# Patient Record
Sex: Female | Born: 1965 | Race: Black or African American | Hispanic: No | Marital: Single | Smoking: Never smoker
Health system: Southern US, Community
[De-identification: ages and names within clinical notes are randomized; demographics above are authoritative.]

## PROBLEM LIST (undated history)

## (undated) HISTORY — PX: ABDOMINAL HYSTERECTOMY: SHX81

---

## 2015-04-16 ENCOUNTER — Emergency Department (HOSPITAL_COMMUNITY)
Admission: EM | Admit: 2015-04-16 | Discharge: 2015-04-17 | Disposition: A | Discharge status: Home / Self Care | Payer: Self-pay | Attending: Emergency Medicine | Admitting: Emergency Medicine

## 2015-04-16 ENCOUNTER — Encounter (HOSPITAL_COMMUNITY): Payer: Self-pay | Admitting: Emergency Medicine

## 2015-04-16 DIAGNOSIS — Z79899 Other long term (current) drug therapy: Secondary | ICD-10-CM | POA: Insufficient documentation

## 2015-04-16 DIAGNOSIS — R110 Nausea: Secondary | ICD-10-CM | POA: Insufficient documentation

## 2015-04-16 DIAGNOSIS — R42 Dizziness and giddiness: Secondary | ICD-10-CM | POA: Insufficient documentation

## 2015-04-16 DIAGNOSIS — R61 Generalized hyperhidrosis: Secondary | ICD-10-CM | POA: Insufficient documentation

## 2015-04-16 DIAGNOSIS — R55 Syncope and collapse: Secondary | ICD-10-CM | POA: Insufficient documentation

## 2015-04-16 LAB — BASIC METABOLIC PANEL
Anion gap: 9 (ref 5–15)
BUN: 13 mg/dL (ref 6–23)
CHLORIDE: 104 mmol/L (ref 96–112)
CO2: 23 mmol/L (ref 19–32)
Calcium: 8.2 mg/dL — ABNORMAL LOW (ref 8.4–10.5)
Creatinine, Ser: 0.96 mg/dL (ref 0.50–1.10)
GFR calc Af Amer: 78 mL/min — ABNORMAL LOW (ref >90)
GFR calc non Af Amer: 67 mL/min — ABNORMAL LOW (ref >90)
Glucose, Bld: 103 mg/dL — ABNORMAL HIGH (ref 70–99)
POTASSIUM: 4.4 mmol/L (ref 3.5–5.1)
SODIUM: 136 mmol/L (ref 135–145)

## 2015-04-16 MED ORDER — SODIUM CHLORIDE 0.9 % IV BOLUS (SEPSIS)
1000.0000 mL | Freq: Once | INTRAVENOUS | Status: AC
  Administered 2015-04-16: 1000 mL via INTRAVENOUS

## 2015-04-16 NOTE — ED Notes (Signed)
Per EMS- pt was giving plasma, near end of giving she became nauseous and weak. Pt reports vomiting and diarrhea. Pt did not lose consciousness. Pt denies CP, SOB. NAD at ths time.

## 2015-04-16 NOTE — ED Provider Notes (Addendum)
CSN: [id number]     Arrival date & time 04/16/15  2156 History   First MD Initiated Contact with Patient 04/16/15 2214     Chief Complaint  Patient presents with  . Near Syncope     (Consider location/radiation/quality/duration/timing/severity/associated sxs/prior Treatment) HPI Comments: Pt with no medical hx, on oral estrogen comes in with cc of near syncope. Reports that she was donating plasma, and after the donation, whilst she was getting saline, she started getting dizzy, nauseated, sick to her stomach and might have even blacked out. She reports some sweating as well. There was no chest pain, dib. Pt has no hx of PE, DVT and denies any long distance travels or surgery in the past 6 weeks, active cancer, recent immobilization.   Patient is a 49 y.o. female presenting with near-syncope. The history is provided by the patient.  Near Syncope Pertinent negatives include no chest pain, no abdominal pain, no headaches and no shortness of breath.    History reviewed. No pertinent past medical history. Past Surgical History  Procedure Laterality Date  . Abdominal hysterectomy     History reviewed. No pertinent family history. History  Substance Use Topics  . Smoking status: Never Smoker   . Smokeless tobacco: Never Used  . Alcohol Use: No   OB History    No data available     Review of Systems  Constitutional: Positive for diaphoresis. Negative for activity change.  Respiratory: Negative for shortness of breath.   Cardiovascular: Positive for near-syncope. Negative for chest pain.  Gastrointestinal: Positive for nausea. Negative for vomiting and abdominal pain.  Genitourinary: Negative for dysuria.  Musculoskeletal: Negative for neck pain.  Neurological: Positive for syncope and light-headedness. Negative for headaches.      Allergies  Review of patient's allergies indicates no known allergies.  Home Medications   Prior to Admission medications   Medication Sig  Start Date End Date Taking? Authorizing Provider  estradiol (ESTRACE) 1 MG tablet Take 1 mg by mouth daily.   Yes Historical Provider, MD  Naproxen Sodium 220 MG CAPS Take 220 mg by mouth daily as needed (pain).   Yes Historical Provider, MD   BP 103/44 mmHg  Pulse 71  Resp 16  Ht  (1.626 m)  Wt 189 lb (85.73 kg)  BMI 32.43 kg/m2  SpO2 99% Physical Exam  Constitutional: She is oriented to person, place, and time. She appears well-developed and well-nourished.  HENT:  Head: Normocephalic and atraumatic.  Eyes: EOM are normal. Pupils are equal, round, and reactive to light.  Neck: Neck supple.  Cardiovascular: Normal rate, regular rhythm and normal heart sounds.   No murmur heard. Pulmonary/Chest: Effort normal. No respiratory distress.  Abdominal: Soft. She exhibits no distension. There is no tenderness. There is no rebound and no guarding.  Musculoskeletal: She exhibits no edema or tenderness.  Neurological: She is alert and oriented to person, place, and time.  Skin: Skin is warm and dry.  Nursing note and vitals reviewed.   ED Course  Procedures (including critical care time) Labs Review Labs Reviewed  CBC WITH DIFFERENTIAL/PLATELET  BASIC METABOLIC PANEL    Imaging Review No results found.   EKG Interpretation   Date/Time:  Monday April 16 2015 22:07:28 EDT Ventricular Rate:  80 PR Interval:  184 QRS Duration: 75 QT Interval:  410 QTC Calculation: 473 R Axis:   79 Text Interpretation:  Sinus rhythm Low voltage, precordial leads Normal  intervals No old tracing to compare Confirmed by Vyolet Sakuma,  MD, Janey GentaANKIT  [phone] on 04/16/2015 10:38:50 PM      MDM   Final diagnoses:  Near syncope    Pt comes in with near syncope / syncope. She had no seizure like activity. Symptoms occurred right after she was done with donating plasma/platelets. No bleeding per history. No cardiac prodrome. On oral estrogen, but doubt PE. Screening ekg ordered. Will hydrate. SF  syncope score is 0.    Derwood KaplanAnkit Hannie Shoe, MD 04/16/15 [id number]  Derwood KaplanAnkit Pacey Altizer, MD 04/17/15 [id number]

## 2015-04-17 LAB — CBC WITH DIFFERENTIAL/PLATELET
BASOS ABS: 0 10*3/uL (ref 0.0–0.1)
Basophils Relative: 0 % (ref 0–1)
EOS PCT: 1 % (ref 0–5)
Eosinophils Absolute: 0.2 10*3/uL (ref 0.0–0.7)
HEMATOCRIT: 37.4 % (ref 36.0–46.0)
Hemoglobin: 12.8 g/dL (ref 12.0–15.0)
LYMPHS ABS: 2.3 10*3/uL (ref 0.7–4.0)
Lymphocytes Relative: 16 % (ref 12–46)
MCH: 26.9 pg (ref 26.0–34.0)
MCHC: 34.2 g/dL (ref 30.0–36.0)
MCV: 78.7 fL (ref 78.0–100.0)
MONO ABS: 0.8 10*3/uL (ref 0.1–1.0)
Monocytes Relative: 5 % (ref 3–12)
Neutro Abs: 11.3 10*3/uL — ABNORMAL HIGH (ref 1.7–7.7)
Neutrophils Relative %: 77 % (ref 43–77)
PLATELETS: 165 10*3/uL (ref 150–400)
RBC: 4.75 MIL/uL (ref 3.87–5.11)
RDW: 14 % (ref 11.5–15.5)
WBC: 14.6 10*3/uL — AB (ref 4.0–10.5)

## 2015-04-17 NOTE — Discharge Instructions (Signed)
We saw you in the ER for the near fainting. All the results in the ER are normal.  The workup in the ER is not complete, and is limited to screening for life threatening and emergent conditions only, so please see a primary care doctor for further evaluation. Hydrate well.   Vasovagal Syncope, Adult Syncope, commonly known as fainting, is a temporary loss of consciousness. It occurs when the blood flow to the brain is reduced. Vasovagal syncope (also called neurocardiogenic syncope) is a fainting spell in which the blood flow to the brain is reduced because of a sudden drop in heart rate and blood pressure. Vasovagal syncope occurs when the brain and the cardiovascular system (blood vessels) do not adequately communicate and respond to each other. This is the most common cause of fainting. It often occurs in response to fear or some other type of emotional or physical stress. The body has a reaction in which the heart starts beating too slowly or the blood vessels expand, reducing blood pressure. This type of fainting spell is generally considered harmless. However, injuries can occur if a person takes a sudden fall during a fainting spell.  CAUSES  Vasovagal syncope occurs when a person's blood pressure and heart rate decrease suddenly, usually in response to a trigger. Many things and situations can trigger an episode. Some of these include:   Pain.   Fear.   The sight of blood or medical procedures, such as blood being drawn from a vein.   Common activities, such as coughing, swallowing, stretching, or going to the bathroom.   Emotional stress.   Prolonged standing, especially in a warm environment.   Lack of sleep or rest.   Prolonged lack of food.   Prolonged lack of fluids.   Recent illness.  The use of certain drugs that affect blood pressure, such as cocaine, alcohol, marijuana, inhalants, and opiates.  SYMPTOMS  Before the fainting episode, you may:   Feel dizzy  or light headed.   Become pale.  Sense that you are going to faint.   Feel like the room is spinning.   Have tunnel vision, only seeing directly in front of you.   Feel sick to your stomach (nauseous).   See spots or slowly lose vision.   Hear ringing in your ears.   Have a headache.   Feel warm and sweaty.   Feel a sensation of pins and needles. During the fainting spell, you will generally be unconscious for no longer than a couple minutes before waking up and returning to normal. If you get up too quickly before your body can recover, you may faint again. Some twitching or jerky movements may occur during the fainting spell.  DIAGNOSIS  Your caregiver will ask about your symptoms, take a medical history, and perform a physical exam. Various tests may be done to rule out other causes of fainting. These may include blood tests and tests to check the heart, such as electrocardiography, echocardiography, and possibly an electrophysiology study. When other causes have been ruled out, a test may be done to check the body's response to changes in position (tilt table test). TREATMENT  Most cases of vasovagal syncope do not require treatment. Your caregiver may recommend ways to avoid fainting triggers and may provide home strategies for preventing fainting. If you must be exposed to a possible trigger, you can drink additional fluids to help reduce your chances of having an episode of vasovagal syncope. If you have warning signs of  an oncoming episode, you can respond by positioning yourself favorably (lying down). If your fainting spells continue, you may be given medicines to prevent fainting. Some medicines may help make you more resistant to repeated episodes of vasovagal syncope. Special exercises or compression stockings may be recommended. In rare cases, the surgical placement of a pacemaker is considered. HOME CARE INSTRUCTIONS   Learn to identify the warning signs of  vasovagal syncope.   Sit or lie down at the first warning sign of a fainting spell. If sitting, put your head down between your legs. If you lie down, swing your legs up in the air to increase blood flow to the brain.   Avoid hot tubs and saunas.  Avoid prolonged standing.  Drink enough fluids to keep your urine clear or pale yellow. Avoid caffeine.  Increase salt in your diet as directed by your caregiver.   If you have to stand for a long time, perform movements such as:   Crossing your legs.   Flexing and stretching your leg muscles.   Squatting.   Moving your legs.   Bending over.   Only take over-the-counter or prescription medicines as directed by your caregiver. Do not suddenly stop any medicines without asking your caregiver first. SEEK MEDICAL CARE IF:   Your fainting spells continue or happen more frequently in spite of treatment.   You lose consciousness for more than a couple minutes.  You have fainting spells during or after exercising or after being startled.   You have new symptoms that occur with the fainting spells, such as:   Shortness of breath.  Chest pain.   Irregular heartbeat.   You have episodes of twitching or jerky movements that last longer than a few seconds.  You have episodes of twitching or jerky movements without obvious fainting. SEEK IMMEDIATE MEDICAL CARE IF:   You have injuries or bleeding after a fainting spell.   You have episodes of twitching or jerky movements that last longer than 5 minutes.   You have more than one spell of twitching or jerky movements before returning to consciousness after fainting. MAKE SURE YOU:   Understand these instructions.  Will watch your condition.  Will get help right away if you are not doing well or get worse. Document Released: 11/24/2012 Document Reviewed: 11/24/2012 [facility] Patient Information 2015 [place], [place]. This information is not intended to replace advice  given to you by your health care provider. Make sure you discuss any questions you have with your health care provider.

## 2019-07-29 ENCOUNTER — Ambulatory Visit
Admission: RE | Admit: 2019-07-29 | Discharge: 2019-07-29 | Disposition: A | Discharge status: Home / Self Care | Payer: Disability Insurance | Source: Ambulatory Visit | Attending: General Practice | Admitting: General Practice

## 2019-07-29 ENCOUNTER — Other Ambulatory Visit: Payer: Self-pay | Admitting: General Practice

## 2019-07-29 DIAGNOSIS — M5126 Other intervertebral disc displacement, lumbar region: Secondary | ICD-10-CM

## 2019-07-29 DIAGNOSIS — G8929 Other chronic pain: Secondary | ICD-10-CM

## 2021-06-17 ENCOUNTER — Other Ambulatory Visit: Payer: Self-pay

## 2021-06-17 ENCOUNTER — Emergency Department: Payer: Medicare Other

## 2021-06-17 ENCOUNTER — Emergency Department
Admission: EM | Admit: 2021-06-17 | Discharge: 2021-06-18 | Disposition: A | Discharge status: Home / Self Care | Payer: Medicare Other | Attending: Emergency Medicine | Admitting: Emergency Medicine

## 2021-06-17 DIAGNOSIS — R197 Diarrhea, unspecified: Secondary | ICD-10-CM | POA: Diagnosis not present

## 2021-06-17 DIAGNOSIS — R1084 Generalized abdominal pain: Secondary | ICD-10-CM | POA: Insufficient documentation

## 2021-06-17 DIAGNOSIS — R1110 Vomiting, unspecified: Secondary | ICD-10-CM | POA: Insufficient documentation

## 2021-06-17 LAB — COMPREHENSIVE METABOLIC PANEL
ALT: 20 U/L (ref 0–44)
AST: 35 U/L (ref 15–41)
Albumin: 4.1 g/dL (ref 3.5–5.0)
Alkaline Phosphatase: 58 U/L (ref 38–126)
Anion gap: 9 (ref 5–15)
BUN: 16 mg/dL (ref 6–20)
CO2: 24 mmol/L (ref 22–32)
Calcium: 9.1 mg/dL (ref 8.9–10.3)
Chloride: 105 mmol/L (ref 98–111)
Creatinine, Ser: 0.8 mg/dL (ref 0.44–1.00)
GFR, Estimated: >60 mL/min (ref >60)
Glucose, Bld: 122 mg/dL — ABNORMAL HIGH (ref 70–99)
Potassium: 3.3 mmol/L — ABNORMAL LOW (ref 3.5–5.1)
Sodium: 138 mmol/L (ref 135–145)
Total Bilirubin: 0.4 mg/dL (ref 0.3–1.2)
Total Protein: 7.7 g/dL (ref 6.5–8.1)

## 2021-06-17 LAB — CBC
HCT: 35.1 % — ABNORMAL LOW (ref 36.0–46.0)
Hemoglobin: 12.3 g/dL (ref 12.0–15.0)
MCH: 27.4 pg (ref 26.0–34.0)
MCHC: 35 g/dL (ref 30.0–36.0)
MCV: 78.2 fL — ABNORMAL LOW (ref 80.0–100.0)
Platelets: 202 K/uL (ref 150–400)
RBC: 4.49 MIL/uL (ref 3.87–5.11)
RDW: 14.5 % (ref 11.5–15.5)
WBC: 12.1 K/uL — ABNORMAL HIGH (ref 4.0–10.5)
nRBC: 0 % (ref 0.0–0.2)

## 2021-06-17 LAB — LIPASE, BLOOD: Lipase: 39 U/L (ref 11–51)

## 2021-06-17 IMAGING — CT CT ABD-PELV W/ CM
2 of 5 series · 17 of 46 positions shown, 19 images · IV contrast (APPLIED)
Comparison: None.

CLINICAL DATA: Right side abdominal pain

EXAM:
CT ABDOMEN AND PELVIS WITH CONTRAST
TECHNIQUE: Multidetector CT imaging of the abdomen and pelvis was performed
using the standard protocol following bolus administration of
intravenous contrast.
CONTRAST:  100mL OMNIPAQUE IOHEXOL 300 MG/ML  SOLN

[Series 2: routine abd/pel with (person_name) · axial · 0.91mm/px · z∈[-650,-225]mm · 14 of 95 slices shown, 16 images]
[im 5/95  soft-tissue]
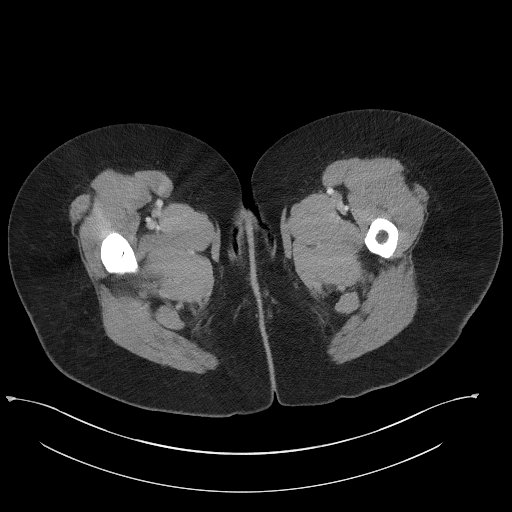
[im 5/95  bone]
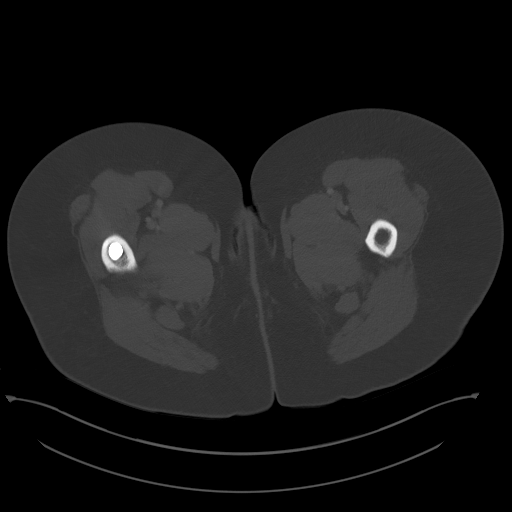
[im 10/95  soft-tissue]
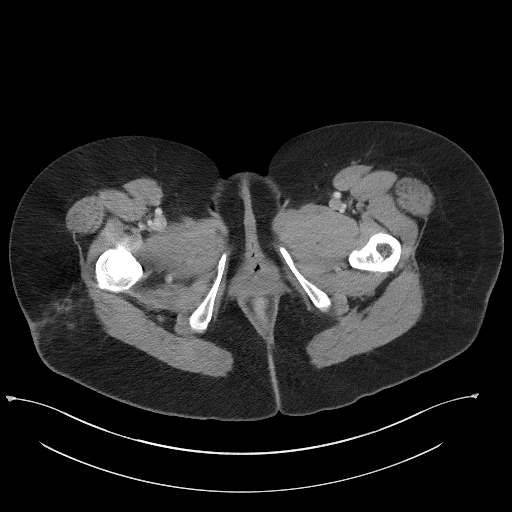
[im 20/95  soft-tissue]
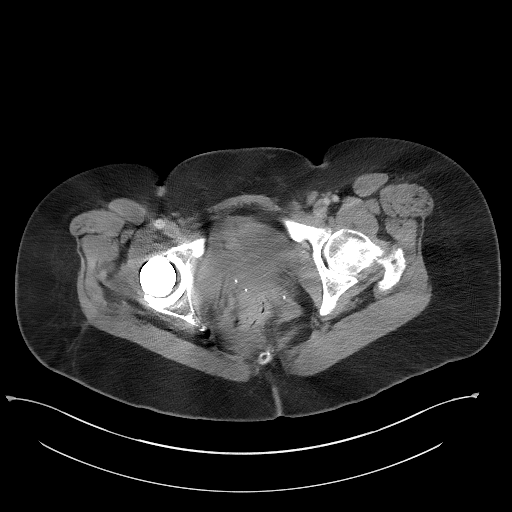
[im 25/95  soft-tissue]
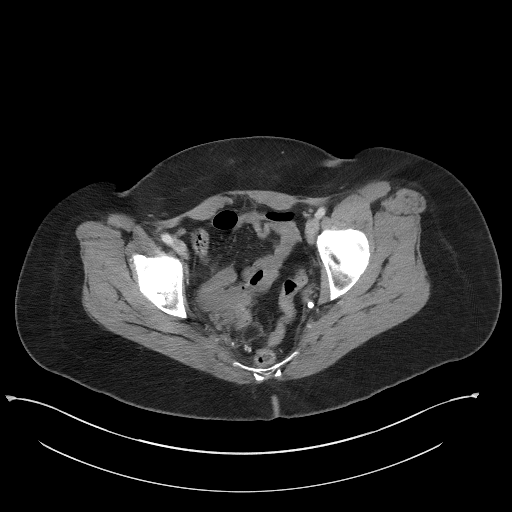
[im 30/95  soft-tissue]
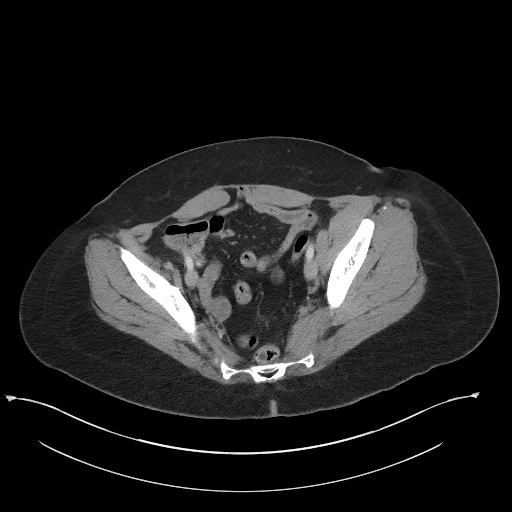
[im 40/95  soft-tissue]
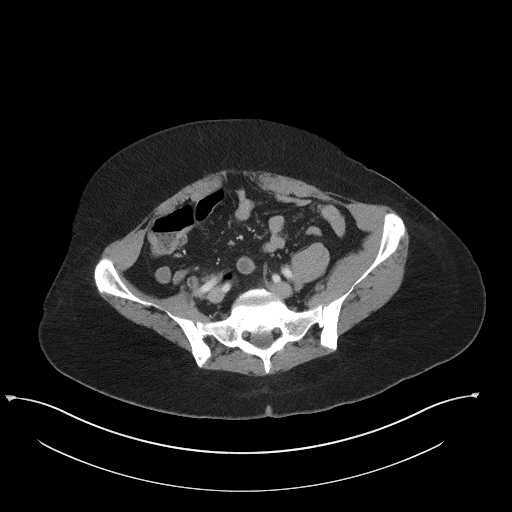
[im 45/95  soft-tissue]
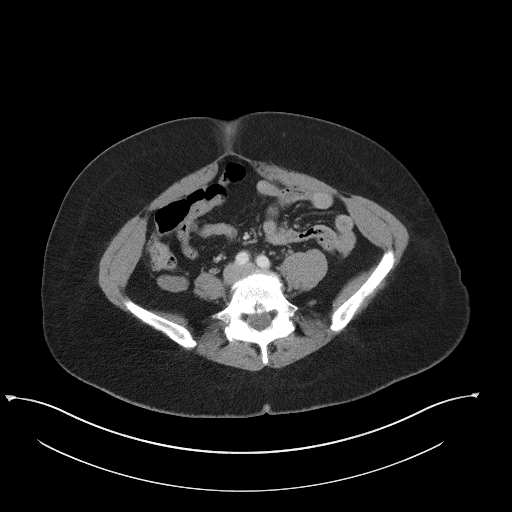
[im 50/95  soft-tissue]
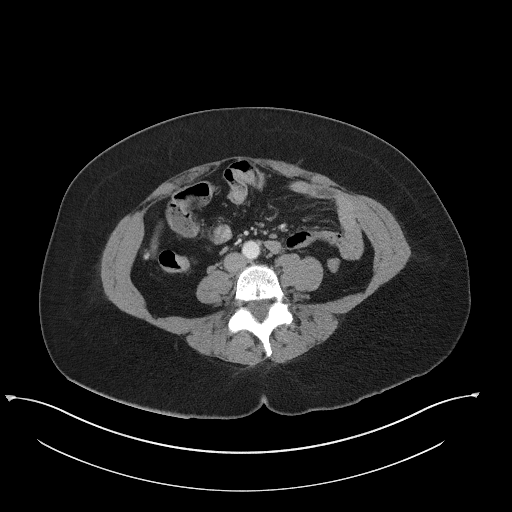
[im 55/95  soft-tissue]
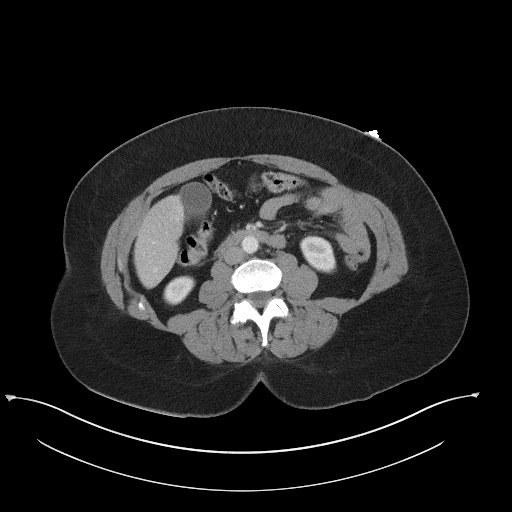
[im 55/95  bone]
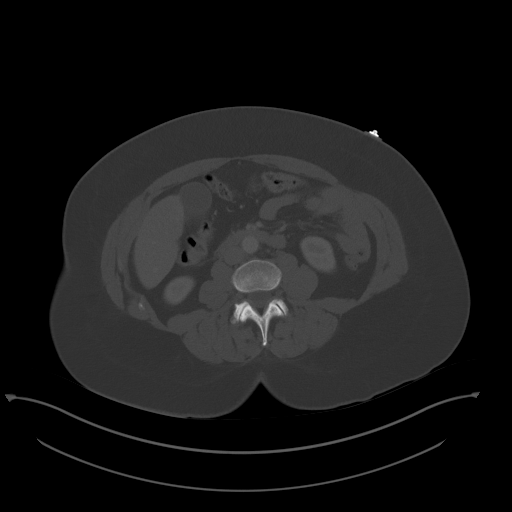
[im 65/95  soft-tissue]
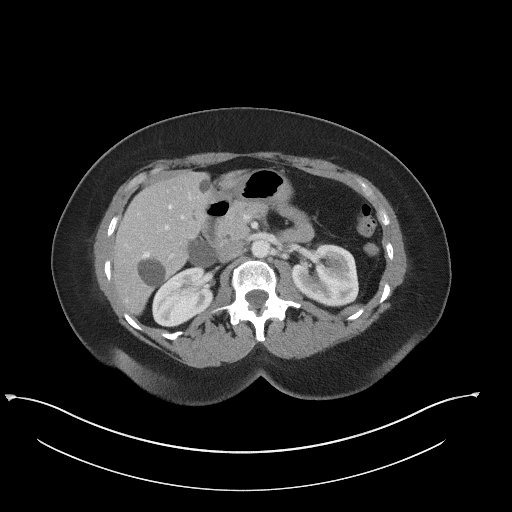
[im 70/95  soft-tissue]
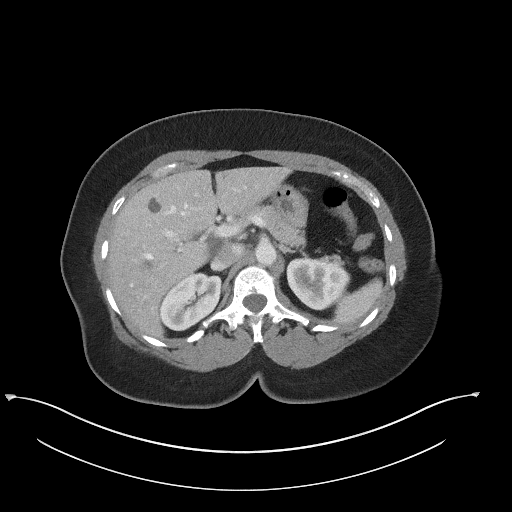
[im 75/95  soft-tissue]
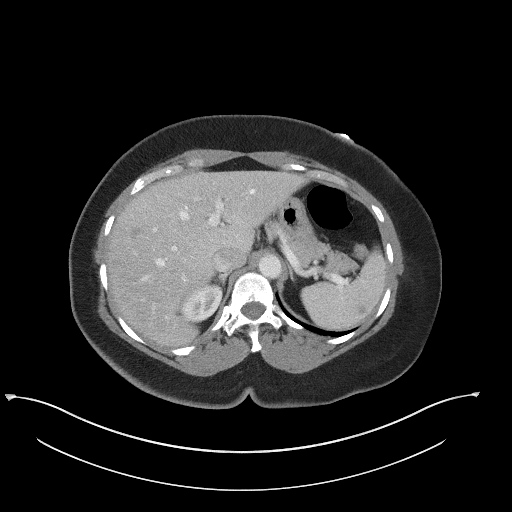
[im 85/95  soft-tissue]
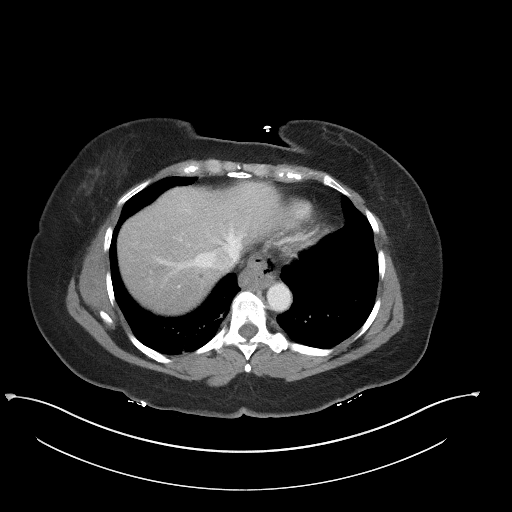
[im 90/95  soft-tissue]
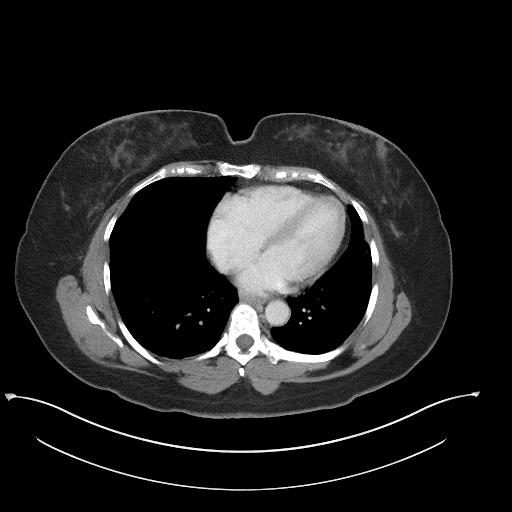

[Series 5: coronal st · coronal · 0.89mm/px · 3 of 96 slices shown]
[im 32/96  soft-tissue]
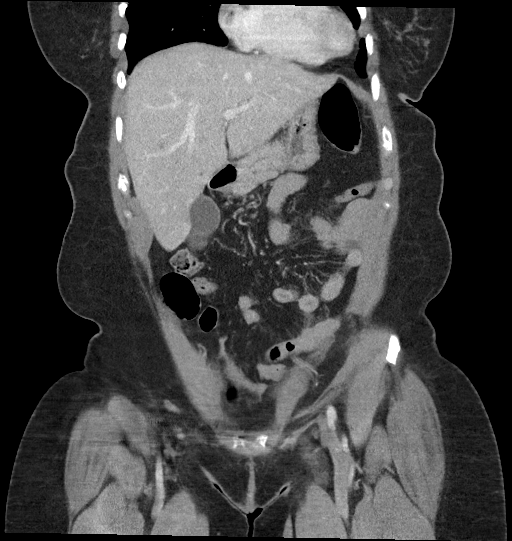
[im 43/96  soft-tissue]
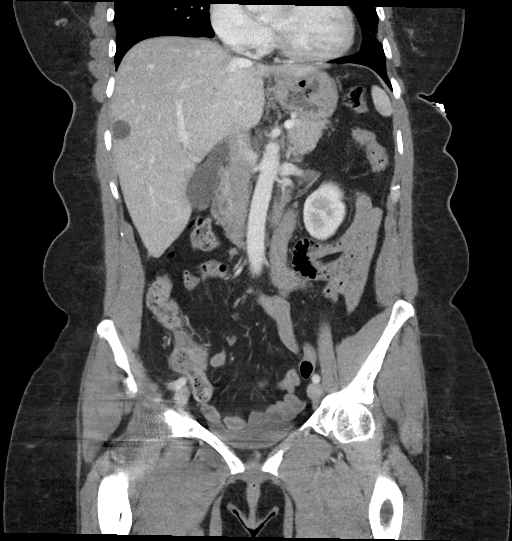
[im 53/96  soft-tissue]
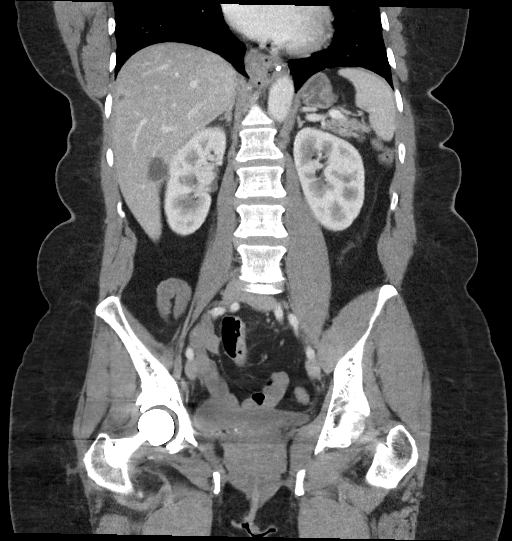

[17 of 46 positions shown; findings below may reference images not displayed]

FINDINGS: Lower chest: No acute abnormality.

Hepatobiliary: Scattered cysts throughout the liver. Gallbladder
unremarkable.

Pancreas: No focal abnormality or ductal dilatation.

Spleen: Small hypodensity in the midpole region, likely cyst. Normal
size.

Adrenals/Urinary Tract: No adrenal abnormality. No focal renal
abnormality. No stones or hydronephrosis. Urinary bladder is
unremarkable.

Stomach/Bowel: Normal appendix. Stomach, large and small bowel
grossly unremarkable.

Vascular/Lymphatic: No evidence of aneurysm or adenopathy.

Reproductive: No visible focal abnormality.

Other: No free fluid or free air.

Musculoskeletal: No acute bony abnormality. Prior right hip
replacement.
IMPRESSION: No acute findings in the abdomen or pelvis.

## 2021-06-17 MED ORDER — ONDANSETRON HCL 4 MG/2ML IJ SOLN
4.0000 mg | Freq: Once | INTRAMUSCULAR | Status: AC
  Administered 2021-06-17: 4 mg via INTRAVENOUS
  Filled 2021-06-17: qty 2

## 2021-06-17 MED ORDER — MORPHINE SULFATE (PF) 4 MG/ML IV SOLN
4.0000 mg | Freq: Once | INTRAVENOUS | Status: AC
  Administered 2021-06-18: 4 mg via INTRAVENOUS
  Filled 2021-06-17: qty 1

## 2021-06-17 MED ORDER — IOHEXOL 300 MG/ML  SOLN
100.0000 mL | Freq: Once | INTRAMUSCULAR | Status: AC | PRN
  Administered 2021-06-17: 100 mL via INTRAVENOUS

## 2021-06-17 MED ORDER — FENTANYL CITRATE (PF) 100 MCG/2ML IJ SOLN
50.0000 ug | Freq: Once | INTRAMUSCULAR | Status: AC
  Administered 2021-06-17: 50 ug via INTRAVENOUS
  Filled 2021-06-17: qty 2

## 2021-06-17 MED ORDER — SODIUM CHLORIDE 0.9 % IV BOLUS (SEPSIS)
1000.0000 mL | Freq: Once | INTRAVENOUS | Status: AC
  Administered 2021-06-18: 1000 mL via INTRAVENOUS

## 2021-06-17 NOTE — ED Notes (Signed)
Signature pad will not reach, pt verbalized understanding of MSE

## 2021-06-17 NOTE — ED Triage Notes (Signed)
Pt reports that she has had pain in her abdomen (upper abdomen/right side upper and lower) on and off since Thursday, worse tonight.  Pt says that she is being treated for "something with my colon" Nausea. Diarrhea.

## 2021-06-17 NOTE — ED Provider Notes (Signed)
[facility] Emergency Department Provider Note  ____________________________________________   Event Date/Time   First MD Initiated Contact with Patient 06/17/21 2258     (approximate)  I have reviewed the triage vital signs and the nursing notes.   HISTORY  Chief Complaint Abdominal Pain    HPI Angela Key is a 55 y.o. female with history of fibromyalgia, lymphocytic colitis who presents to the emergency department complaints of sharp, severe right-sided abdominal pain that has been ongoing for 4 days.  States pain is intermittent.  Has had similar symptoms before with her colitis and states she has been on medications by her gastroenterologist.  States she had a colonoscopy last month..  Has been vomiting and having diarrhea.  No bloody stool or melena.No known fevers.  No chest pain, shortness of breath.  Has had previous hysterectomy, unilateral salpingectomy due to previous ectopic pregnancy, previous oophorectomy due to ovarian cysts.  States she is taking Aleve regularly for pain.     On review of records from care everywhere, it appears patient had a endoscopy, colonoscopy on 04/25/2021 at [facility] by Dr. Erma Heritage.  Patient had patchy mildly erythematous mucosa of the gastric antrum, small hiatal hernia, 5 mm polyp in the transverse colon that was removed.  It appears she was started on Entocort due to inflammation of her colon.  No past medical history on file.  There are no problems to display for this patient.   Past Surgical History:  Procedure Laterality Date   ABDOMINAL HYSTERECTOMY      Prior to Admission medications   Medication Sig Start Date End Date Taking? Authorizing Provider  dicyclomine (BENTYL) 20 MG tablet Take 1 tablet (20 mg total) by mouth every 8 (eight) hours as needed for spasms (Abdominal cramping). 06/18/21  Yes Elaine Middleton, Layla Maw, DO  HYDROcodone-acetaminophen (NORCO/VICODIN) 5-325 MG tablet Take 1 tablet by mouth every 4  (four) hours as needed. 06/18/21  Yes Nainika Newlun, Baxter Hire N, DO  ondansetron (ZOFRAN ODT) 4 MG disintegrating tablet Take 1 tablet (4 mg total) by mouth every 6 (six) hours as needed for nausea or vomiting. 06/18/21  Yes Makinzie Considine, Layla Maw, DO  estradiol (ESTRACE) 1 MG tablet Take 1 mg by mouth daily.    [provider]  Naproxen Sodium 220 MG CAPS Take 220 mg by mouth daily as needed (pain).    [provider]    Allergies Patient has no known allergies.  No family history on file.  Social History Social History   Tobacco Use   Smoking status: Never   Smokeless tobacco: Never  Substance Use Topics   Alcohol use: No   Drug use: No    Review of Systems Constitutional: No fever. Eyes: No visual changes. ENT: No sore throat. Cardiovascular: Denies chest pain. Respiratory: Denies shortness of breath. Gastrointestinal: + nausea, vomiting, diarrhea. Genitourinary: Negative for dysuria. Musculoskeletal: Negative for back pain. Skin: Negative for rash. Neurological: Negative for focal weakness or numbness.  ____________________________________________   PHYSICAL EXAM:  VITAL SIGNS: ED Triage Vitals  Enc Vitals Group     BP 06/17/21 2237 (!) 125/58     Pulse Rate 06/17/21 2237 (!) 59     Resp 06/17/21 2237 (!) 22     Temp 06/17/21 2237 98.4 F (36.9 C)     Temp Source 06/17/21 2237 Oral     SpO2 06/17/21 2237 100 %     Weight --      Height --  Head Circumference --      Peak Flow --      Pain Score 06/17/21 2233 10     Pain Loc --      Pain Edu? --      Excl. in GC? --    CONSTITUTIONAL: Alert and oriented and responds appropriately to questions.  Appears uncomfortable, slightly diaphoretic, afebrile HEAD: Normocephalic EYES: Conjunctivae clear, pupils appear equal, EOM appear intact ENT: normal nose; moist mucous membranes NECK: Supple, normal ROM CARD: RRR; S1 and S2 appreciated; no murmurs, no clicks, no rubs, no gallops RESP: Normal chest  excursion without splinting or tachypnea; breath sounds clear and equal bilaterally; no wheezes, no rhonchi, no rales, no hypoxia or respiratory distress, speaking full sentences ABD/GI: Normal bowel sounds; non-distended; soft, diffusely tender throughout the right side of the abdomen with intermittent guarding, no rebound BACK: The back appears normal EXT: Normal ROM in all joints; no deformity noted, no edema; no cyanosis SKIN: Normal color for age and race; warm; no rash on exposed skin NEURO: Moves all extremities equally PSYCH: The patient's mood and manner are appropriate.  ____________________________________________   LABS (all labs ordered are listed, but only abnormal results are displayed)  Labs Reviewed  COMPREHENSIVE METABOLIC PANEL - Abnormal; Notable for the following components:      Result Value   Potassium 3.3 (*)    Glucose, Bld 122 (*)    All other components within normal limits  CBC - Abnormal; Notable for the following components:   WBC 12.1 (*)    HCT 35.1 (*)    MCV 78.2 (*)    All other components within normal limits  URINALYSIS, COMPLETE (UACMP) WITH MICROSCOPIC - Abnormal; Notable for the following components:   Color, Urine STRAW (*)    APPearance CLEAR (*)    Specific Gravity, Urine 1.039 (*)    All other components within normal limits  LIPASE, BLOOD   ____________________________________________  EKG    EKG Interpretation  Date/Time:  Monday June 17 2021 22:38:52 EDT Ventricular Rate:  66 PR Interval:  196 QRS Duration: 72 QT Interval:  436 QTC Calculation: 457 R Axis:   59 Text Interpretation: Sinus rhythm with Premature atrial complexes with Abberant conduction Nonspecific ST and T wave abnormality Abnormal ECG Confirmed by Rochele Raring [phone] on 06/17/2021 11:27:53 PM           ____________________________________________  RADIOLOGY Normajean Baxter Stephanieann Popescu, personally viewed and evaluated these images (plain radiographs) as part  of my medical decision making, as well as reviewing the written report by the radiologist.  ED MD interpretation: No acute abnormality seen.  Official radiology report(s): CT ABDOMEN PELVIS W CONTRAST  Result Date: 06/17/2021 CLINICAL DATA:  Right side abdominal pain EXAM: CT ABDOMEN AND PELVIS WITH CONTRAST TECHNIQUE: Multidetector CT imaging of the abdomen and pelvis was performed using the standard protocol following bolus administration of intravenous contrast. CONTRAST:  OMNIPAQUE IOHEXOL 300 MG/ML  SOLN COMPARISON:  None. FINDINGS: Lower chest: No acute abnormality. Hepatobiliary: Scattered cysts throughout the liver. Gallbladder unremarkable. Pancreas: No focal abnormality or ductal dilatation. Spleen: Small hypodensity in the midpole region, likely cyst. Normal size. Adrenals/Urinary Tract: No adrenal abnormality. No focal renal abnormality. No stones or hydronephrosis. Urinary bladder is unremarkable. Stomach/Bowel: Normal appendix. Stomach, large and small bowel grossly unremarkable. Vascular/Lymphatic: No evidence of aneurysm or adenopathy. Reproductive: No visible focal abnormality. Other: No free fluid or free air. Musculoskeletal: No acute bony abnormality. Prior right hip replacement. IMPRESSION: No acute findings  in the abdomen or pelvis. Electronically Signed   By: Charlett Nose M.D.   On: 06/17/2021 23:49    ____________________________________________   PROCEDURES  Procedure(s) performed (including Critical Care):  Procedures   ____________________________________________   INITIAL IMPRESSION / ASSESSMENT AND PLAN / ED COURSE  As part of my medical decision making, I reviewed the following data within the electronic MEDICAL RECORD NUMBER History obtained from family, Nursing notes reviewed and incorporated, Labs reviewed , Old chart reviewed, Radiograph reviewed , Notes from prior ED visits, and [place] [employer] Controlled Substance Database         Patient here with right-sided  abdominal pain, vomiting and diarrhea.  Differential includes flare of her lymphocytic colitis, cholecystitis, cholangitis, biliary colic, pancreatitis, appendicitis, bowel obstruction, perforation, abscess, UTI, kidney stone, pyelonephritis.  Labs currently pending.  Will obtain CT of abdomen pelvis, urinalysis.  Given fentanyl, Zofran in triage and reports feeling somewhat better.  Still having pain.  Will give morphine, IV fluids.  ED PROGRESS  Patient CT scan shows no acute abnormality.  Urine shows no sign of infection, ketones.  She reports feeling better and is able to tolerate p.o. here.  She reports that the symptoms have been ongoing for about a month now and will wax and wane.  I have encouraged her to follow-up closely with her gastroenterologist.  Will discharge with prescriptions of Bentyl, Vicodin, Zofran.  Discussed return precautions.    At this time, I do not feel there is any life-threatening condition present. I have reviewed, interpreted and discussed all results (EKG, imaging, lab, urine as appropriate) and exam findings with patient/family. I have reviewed nursing notes and appropriate previous records.  I feel the patient is safe to be discharged home without further emergent workup and can continue workup as an outpatient as needed. Discussed usual and customary return precautions. Patient/family verbalize understanding and are comfortable with this plan.  Outpatient follow-up has been provided as needed. All questions have been answered.  ____________________________________________   FINAL CLINICAL IMPRESSION(S) / ED DIAGNOSES  Final diagnoses:  Generalized abdominal pain     ED Discharge Orders          Ordered    ondansetron (ZOFRAN ODT) 4 MG disintegrating tablet  Every 6 hours PRN        06/18/21 0219    dicyclomine (BENTYL) 20 MG tablet  Every 8 hours PRN        06/18/21 0219    HYDROcodone-acetaminophen (NORCO/VICODIN) 5-325 MG tablet  Every 4 hours PRN         06/18/21 0219            *Please note:  Ambree Frances was evaluated in Emergency Department on 06/18/2021 for the symptoms described in the history of present illness. She was evaluated in the context of the global COVID-19 pandemic, which necessitated consideration that the patient might be at risk for infection with the SARS-CoV-2 virus that causes COVID-19. Institutional protocols and algorithms that pertain to the evaluation of patients at risk for COVID-19 are in a state of rapid change based on information released by regulatory bodies including the CDC and federal and state organizations. These policies and algorithms were followed during the patient's care in the ED.  Some ED evaluations and interventions may be delayed as a result of limited staffing during and the pandemic.*   Note:  This document was prepared using Dragon voice recognition software and may include unintentional dictation errors.    Deboraha Goar, Layla Maw, DO  06/18/21 0219  

## 2021-06-17 NOTE — ED Notes (Signed)
Pt with large amounts of food particle emesis in triage

## 2021-06-18 DIAGNOSIS — R1084 Generalized abdominal pain: Secondary | ICD-10-CM | POA: Diagnosis not present

## 2021-06-18 LAB — URINALYSIS, COMPLETE (UACMP) WITH MICROSCOPIC
Bacteria, UA: NONE SEEN
Bilirubin Urine: NEGATIVE
Glucose, UA: NEGATIVE mg/dL
Hgb urine dipstick: NEGATIVE
Ketones, ur: NEGATIVE mg/dL
Leukocytes,Ua: NEGATIVE
Nitrite: NEGATIVE
Protein, ur: NEGATIVE mg/dL
Specific Gravity, Urine: 1.039 — ABNORMAL HIGH (ref 1.005–1.030)
pH: 6 (ref 5.0–8.0)

## 2021-06-18 MED ORDER — HYDROMORPHONE HCL 1 MG/ML IJ SOLN
1.0000 mg | Freq: Once | INTRAMUSCULAR | Status: AC
  Administered 2021-06-18: 1 mg via INTRAVENOUS
  Filled 2021-06-18: qty 1

## 2021-06-18 MED ORDER — DICYCLOMINE HCL 20 MG PO TABS: 20.0000 mg | ORAL_TABLET | Freq: Three times a day (TID) | ORAL | 0 refills | Status: AC | PRN

## 2021-06-18 MED ORDER — ONDANSETRON 4 MG PO TBDP: 4.0000 mg | ORAL_TABLET | Freq: Four times a day (QID) | ORAL | 0 refills | Status: AC | PRN

## 2021-06-18 MED ORDER — HYDROCODONE-ACETAMINOPHEN 5-325 MG PO TABS: 1.0000 | ORAL_TABLET | ORAL | 0 refills | Status: AC | PRN

## 2021-06-18 MED ORDER — ONDANSETRON HCL 4 MG/2ML IJ SOLN
4.0000 mg | Freq: Once | INTRAMUSCULAR | Status: AC
  Administered 2021-06-18: 4 mg via INTRAVENOUS
  Filled 2021-06-18: qty 2

## 2021-06-18 NOTE — ED Notes (Signed)
Pt. And family updated on POC. Will monitor post narcotic, and d/c when ready. MD aware.

## 2021-06-18 NOTE — ED Notes (Addendum)
Pt resting.

## 2021-06-18 NOTE — Discharge Instructions (Addendum)
I recommend close follow-up with your primary care physician and gastroenterologist for continued treatment of your pain, lymphocytic colitis.  Your work-up from the emergency department today was reassuring with normal labs, urine and a CT of your abdomen pelvis.  I recommend that you continue your Entocort at this time.   You are being provided a prescription for opiates (also known as narcotics) for pain control.  Opiates can be addictive and should only be used when absolutely necessary for pain control when other alternatives do not work.  We recommend you only use them for the recommended amount of time and only as prescribed.  Please do not take with other sedative medications or alcohol.  Please do not drive, operate machinery, make important decisions while taking opiates.  Please note that these medications can be addictive and have high abuse potential.  Patients can become addicted to narcotics after only taking them for a few days.  Please keep these medications locked away from children, teenagers or any family members with history of substance abuse.  Narcotic pain medicine may also make you constipated.  You may use over-the-counter medications such as MiraLAX, Colace to prevent constipation.  If you become constipated you may use over-the-counter enemas as needed.  Itching and nausea are common side effects of narcotic pain medication.  If you develop uncontrolled vomiting or a rash, please stop these medications.

## 2021-06-18 NOTE — ED Notes (Addendum)
Pt. Passed PO challenge with ginger ale. Pt. States she feels significantly better. Pt. Is conversational and joking. DO notified.

## 2021-09-11 ENCOUNTER — Other Ambulatory Visit: Payer: Self-pay

## 2021-09-11 ENCOUNTER — Encounter: Payer: Self-pay | Admitting: *Deleted

## 2021-09-11 DIAGNOSIS — R1084 Generalized abdominal pain: Secondary | ICD-10-CM | POA: Diagnosis not present

## 2021-09-11 DIAGNOSIS — R109 Unspecified abdominal pain: Secondary | ICD-10-CM | POA: Diagnosis present

## 2021-09-11 DIAGNOSIS — R112 Nausea with vomiting, unspecified: Secondary | ICD-10-CM | POA: Diagnosis not present

## 2021-09-11 LAB — LIPASE, BLOOD: Lipase: 44 U/L (ref 11–51)

## 2021-09-11 LAB — CBC
HCT: 36.7 % (ref 36.0–46.0)
Hemoglobin: 12.3 g/dL (ref 12.0–15.0)
MCH: 27 pg (ref 26.0–34.0)
MCHC: 33.5 g/dL (ref 30.0–36.0)
MCV: 80.7 fL (ref 80.0–100.0)
Platelets: 163 10*3/uL (ref 150–400)
RBC: 4.55 MIL/uL (ref 3.87–5.11)
RDW: 13.8 % (ref 11.5–15.5)
WBC: 10.7 10*3/uL — ABNORMAL HIGH (ref 4.0–10.5)
nRBC: 0 % (ref 0.0–0.2)

## 2021-09-11 LAB — COMPREHENSIVE METABOLIC PANEL
ALT: 82 U/L — ABNORMAL HIGH (ref 0–44)
AST: 85 U/L — ABNORMAL HIGH (ref 15–41)
Albumin: 3.9 g/dL (ref 3.5–5.0)
Alkaline Phosphatase: 59 U/L (ref 38–126)
Anion gap: 8 (ref 5–15)
BUN: 16 mg/dL (ref 6–20)
CO2: 27 mmol/L (ref 22–32)
Calcium: 9.2 mg/dL (ref 8.9–10.3)
Chloride: 103 mmol/L (ref 98–111)
Creatinine, Ser: 0.76 mg/dL (ref 0.44–1.00)
GFR, Estimated: >60 mL/min (ref >60)
Glucose, Bld: 119 mg/dL — ABNORMAL HIGH (ref 70–99)
Potassium: 3.4 mmol/L — ABNORMAL LOW (ref 3.5–5.1)
Sodium: 138 mmol/L (ref 135–145)
Total Bilirubin: 0.7 mg/dL (ref 0.3–1.2)
Total Protein: 7.2 g/dL (ref 6.5–8.1)

## 2021-09-11 LAB — TROPONIN I (HIGH SENSITIVITY): Troponin I (High Sensitivity): 3 ng/L (ref <18)

## 2021-09-11 NOTE — ED Triage Notes (Signed)
Pt to ED reporting generalized abd pain for the past two days with NVD. Syncopal episode this evening. No fevers.

## 2021-09-12 ENCOUNTER — Emergency Department
Admission: EM | Admit: 2021-09-12 | Discharge: 2021-09-12 | Disposition: A | Discharge status: Home / Self Care | Payer: Medicare Other | Attending: Emergency Medicine | Admitting: Emergency Medicine

## 2021-09-12 DIAGNOSIS — R1084 Generalized abdominal pain: Secondary | ICD-10-CM

## 2021-09-12 LAB — URINALYSIS, COMPLETE (UACMP) WITH MICROSCOPIC
Bacteria, UA: NONE SEEN
Bilirubin Urine: NEGATIVE
Glucose, UA: NEGATIVE mg/dL
Hgb urine dipstick: NEGATIVE
Ketones, ur: NEGATIVE mg/dL
Leukocytes,Ua: NEGATIVE
Nitrite: NEGATIVE
Protein, ur: NEGATIVE mg/dL
Specific Gravity, Urine: 1.015 (ref 1.005–1.030)
pH: 7 (ref 5.0–8.0)

## 2021-09-12 LAB — TROPONIN I (HIGH SENSITIVITY): Troponin I (High Sensitivity): 3 ng/L (ref <18)

## 2021-09-12 MED ORDER — DICYCLOMINE HCL 10 MG PO CAPS
20.0000 mg | ORAL_CAPSULE | Freq: Once | ORAL | Status: AC
  Administered 2021-09-12: 20 mg via ORAL
  Filled 2021-09-12: qty 2

## 2021-09-12 MED ORDER — HALOPERIDOL LACTATE 5 MG/ML IJ SOLN
2.5000 mg | Freq: Once | INTRAMUSCULAR | Status: AC
  Administered 2021-09-12: 2.5 mg via INTRAVENOUS
  Filled 2021-09-12: qty 1

## 2021-09-12 MED ORDER — ONDANSETRON HCL 4 MG PO TABS: 4.0000 mg | ORAL_TABLET | Freq: Three times a day (TID) | ORAL | 0 refills | Status: AC | PRN

## 2021-09-12 MED ORDER — SODIUM CHLORIDE 0.9 % IV BOLUS
1000.0000 mL | Freq: Once | INTRAVENOUS | Status: AC
  Administered 2021-09-12: 1000 mL via INTRAVENOUS

## 2021-09-12 MED ORDER — DICYCLOMINE HCL 10 MG PO CAPS: 10.0000 mg | ORAL_CAPSULE | Freq: Three times a day (TID) | ORAL | 0 refills | Status: AC | PRN

## 2021-09-12 NOTE — Discharge Instructions (Addendum)
Please seek medical attention for any high fevers, chest pain, shortness of breath, change in behavior, persistent vomiting, bloody stool or any other new or concerning symptoms.  

## 2021-09-12 NOTE — ED Provider Notes (Signed)
[facility] Emergency Department Provider Note   ____________________________________________   I have reviewed the triage vital signs and the nursing notes.   HISTORY  Chief Complaint Abdominal Pain   History limited by: Not Limited   HPI Angela Key is a 55 y.o. female who presents to the emergency department today because of concern for abdominal pain. The patient states that it started yesterday. Described as cramping. It is severe. Located throughout her abdomen. Has been accompanied by nausea and vomiting. She did pass out with her symptoms. She says she has had similar symptoms in the past, with the abdominal cramping as well as passing out, and has seen a GI doctor for them. She tried taking the medication that was prescribed to her for her abdominal issues without any significant relief.    Records reviewed. Per medical record review patient has a history of ER visit over the summer with similar symptoms, negative ct scan at that time.   History reviewed. No pertinent past medical history.  There are no problems to display for this patient.   Past Surgical History:  Procedure Laterality Date   ABDOMINAL HYSTERECTOMY      Prior to Admission medications   Medication Sig Start Date End Date Taking? Authorizing Provider  dicyclomine (BENTYL) 20 MG tablet Take 1 tablet (20 mg total) by mouth every 8 (eight) hours as needed for spasms (Abdominal cramping). 06/18/21   Ward, Layla Maw, DO  estradiol (ESTRACE) 1 MG tablet Take 1 mg by mouth daily.    [provider]  HYDROcodone-acetaminophen (NORCO/VICODIN) 5-325 MG tablet Take 1 tablet by mouth every 4 (four) hours as needed. 06/18/21   Ward, Layla Maw, DO  Naproxen Sodium 220 MG CAPS Take 220 mg by mouth daily as needed (pain).    [provider]  ondansetron (ZOFRAN ODT) 4 MG disintegrating tablet Take 1 tablet (4 mg total) by mouth every 6 (six) hours as needed for nausea or  vomiting. 06/18/21   Ward, Layla Maw, DO    Allergies Patient has no known allergies.  History reviewed. No pertinent family history.  Social History Social History   Tobacco Use   Smoking status: Never   Smokeless tobacco: Never  Substance Use Topics   Alcohol use: No   Drug use: No    Review of Systems Constitutional: No fever/chills Eyes: No visual changes. ENT: No sore throat. Cardiovascular: Denies chest pain. Respiratory: Denies shortness of breath. Gastrointestinal: Positive for abdominal pain, nausea and vomiting.  Genitourinary: Negative for dysuria. Musculoskeletal: Negative for back pain. Skin: Negative for rash. Neurological: Positive for syncopal episode.   ____________________________________________   PHYSICAL EXAM:  VITAL SIGNS: ED Triage Vitals [09/11/21 2255]  Enc Vitals Group     BP 123/60     Pulse Rate 89     Resp 16     Temp 98 F (36.7 C)     Temp Source Oral     SpO2 97 %     Weight 188 lb 15 oz (85.7 kg)     Height 5\' 4"  (1.626 m)     Head Circumference      Peak Flow      Pain Score 10   Constitutional: Alert and oriented.  Eyes: Conjunctivae are normal.  ENT      Head: Normocephalic and atraumatic.      Nose: No congestion/rhinnorhea.      Mouth/Throat: Mucous membranes are moist.      Neck: No stridor. Hematological/Lymphatic/Immunilogical:  No cervical lymphadenopathy. Cardiovascular: Normal rate, regular rhythm.  No murmurs, rubs, or gallops.  Respiratory: Normal respiratory effort without tachypnea nor retractions. Breath sounds are clear and equal bilaterally. No wheezes/rales/rhonchi. Gastrointestinal: Soft and diffusely tender to palpation.  Genitourinary: Deferred Musculoskeletal: Normal range of motion in all extremities. No lower extremity edema. Neurologic:  Normal speech and language. No gross focal neurologic deficits are appreciated.  Skin:  Skin is warm, dry and intact. No rash noted. Psychiatric: Mood and  affect are normal. Speech and behavior are normal. Patient exhibits appropriate insight and judgment.  ____________________________________________    LABS (pertinent positives/negatives)  Trop hs 3 Lipase 44 CBC wbc 10.7, hgb 12.3, plt 163 CMP wnl except k 3.4, glu 119, ast 85, alt 82  ____________________________________________   EKG  I, Phineas Semen, attending physician, personally viewed and interpreted this EKG  EKG Time: 2302 Rate: 76 Rhythm: sinus rhythm  Axis: normal Intervals: qtc 423 QRS: narrow, q wave v1 ST changes: no st elevation Impression: abnormal ekg   ____________________________________________    RADIOLOGY  None  ____________________________________________   PROCEDURES  Procedures  ____________________________________________   INITIAL IMPRESSION / ASSESSMENT AND PLAN / ED COURSE  Pertinent labs & imaging results that were available during my care of the patient were reviewed by me and considered in my medical decision making (see chart for details).   Patient presented to the emergency department today because of concern for abdominal pain. Has had similar symptoms in the past. Also had a syncopal episode. Work up here without leukocytosis. No fever. No focal tenderness on exam. Patient was given medication here and did have improvement. At this time will defer emergent abdominal imaging given history of similar symptoms in the past with negative imaging. Will plan on discharging with prescription for bentyl and an antiemetic.   ____________________________________________   FINAL CLINICAL IMPRESSION(S) / ED DIAGNOSES  Final diagnoses:  Generalized abdominal pain     Note: This dictation was prepared with Dragon dictation. Any transcriptional errors that result from this process are unintentional     Phineas Semen, MD 09/12/21 [phone]
